# Patient Record
Sex: Female | Born: 1985 | Race: White | Hispanic: No | Marital: Married | Smoking: Never smoker
Health system: Southern US, Community
[De-identification: ages and names within clinical notes are randomized; demographics above are authoritative.]

## PROBLEM LIST (undated history)

## (undated) DIAGNOSIS — R87629 Unspecified abnormal cytological findings in specimens from vagina: Secondary | ICD-10-CM

## (undated) HISTORY — DX: Unspecified abnormal cytological findings in specimens from vagina: R87.629

---

## 2006-07-04 HISTORY — PX: BREAST SURGERY: SHX581

## 2008-11-23 ENCOUNTER — Emergency Department (HOSPITAL_COMMUNITY): Admission: EM | Admit: 2008-11-23 | Discharge: 2008-11-23 | Payer: Self-pay | Admitting: Family Medicine

## 2012-11-05 ENCOUNTER — Other Ambulatory Visit (HOSPITAL_COMMUNITY): Payer: Self-pay | Admitting: Obstetrics and Gynecology

## 2012-11-05 DIAGNOSIS — N979 Female infertility, unspecified: Secondary | ICD-10-CM

## 2012-11-08 ENCOUNTER — Ambulatory Visit (HOSPITAL_COMMUNITY)
Admission: RE | Admit: 2012-11-08 | Discharge: 2012-11-08 | Disposition: A | Discharge status: Home / Self Care | Payer: BC Managed Care – PPO | Source: Ambulatory Visit | Attending: Obstetrics and Gynecology | Admitting: Obstetrics and Gynecology

## 2012-11-08 DIAGNOSIS — N979 Female infertility, unspecified: Secondary | ICD-10-CM | POA: Insufficient documentation

## 2012-11-08 MED ORDER — IOHEXOL 300 MG/ML  SOLN
20.0000 mL | Freq: Once | INTRAMUSCULAR | Status: AC | PRN
  Administered 2012-11-08: 5 mL

## 2013-09-28 IMAGING — RF DG HYSTEROGRAM
6 series · 6 of 6 positions shown · non-contrast
Comparison: none

CLINICAL DATA: Infertility

HYSTEROSALPINGOGRAM
TECHNIQUE: Hysterosalpingogram was performed by the ordering
physician under fluoroscopy.  Fluoroscopic images are submitted for
interpretation following the procedure.
Fluoroscopy Time:  30 seconds

[Series 1: run · 1 of 1 slices shown (1 of 6)]
[im 1/1]
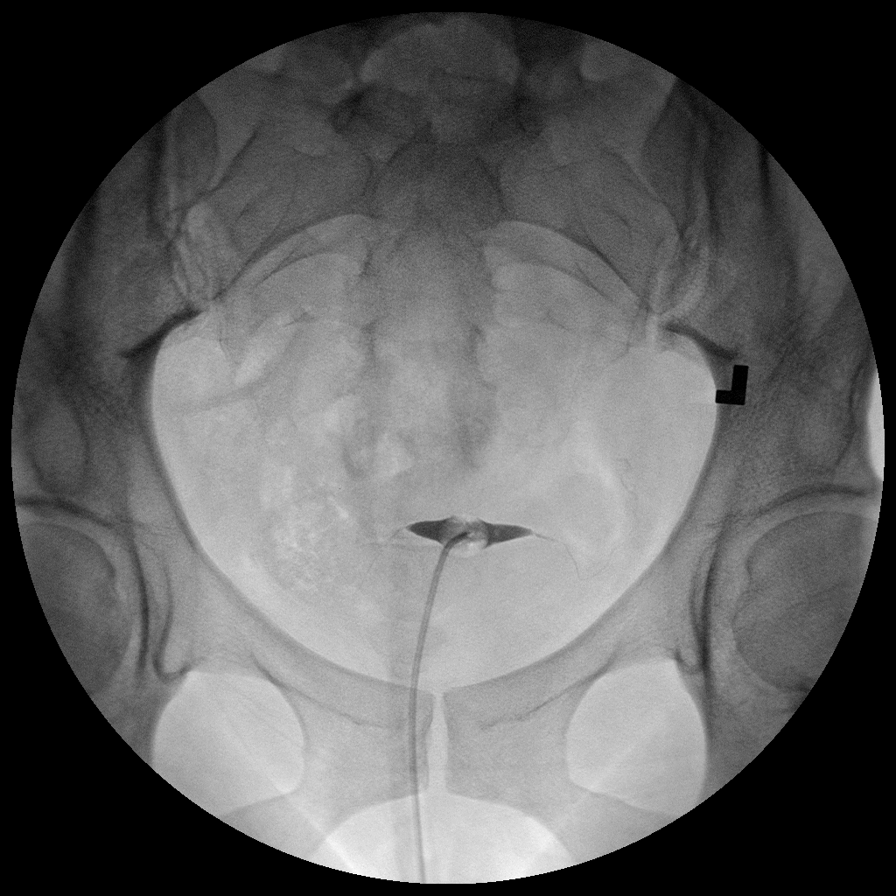

[Series 2: run · 1 of 1 slices shown (2 of 6)]
[im 1/1]
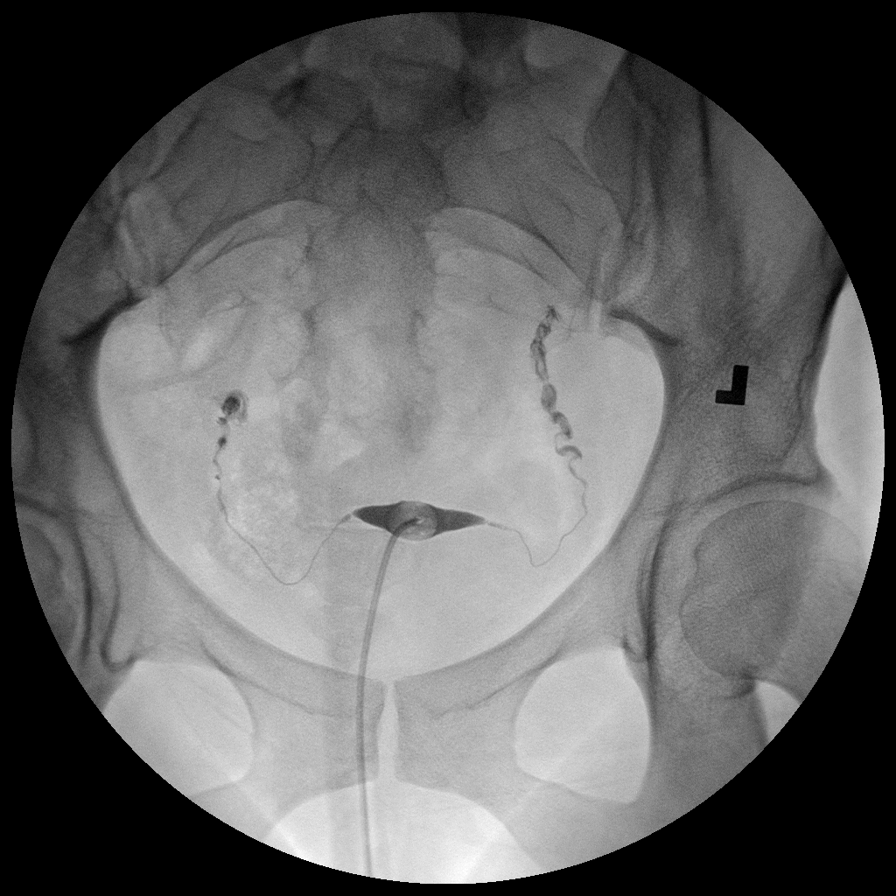

[Series 3: run · 1 of 1 slices shown (3 of 6)]
[im 1/1]
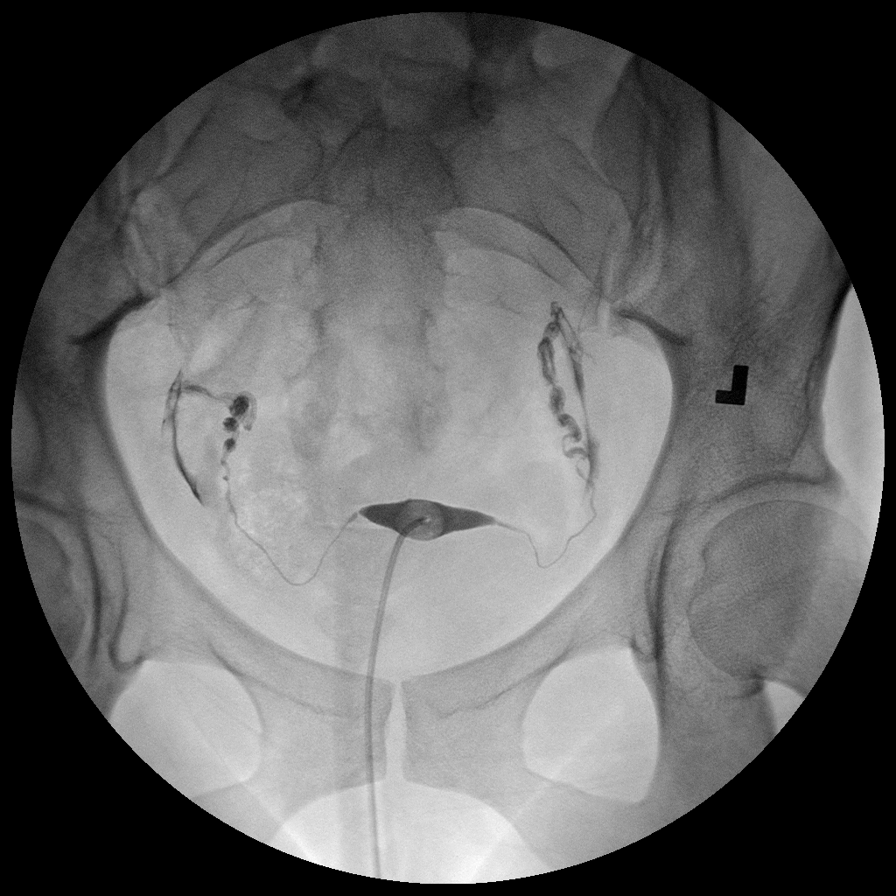

[Series 4: run · 1 of 1 slices shown (4 of 6)]
[im 1/1]
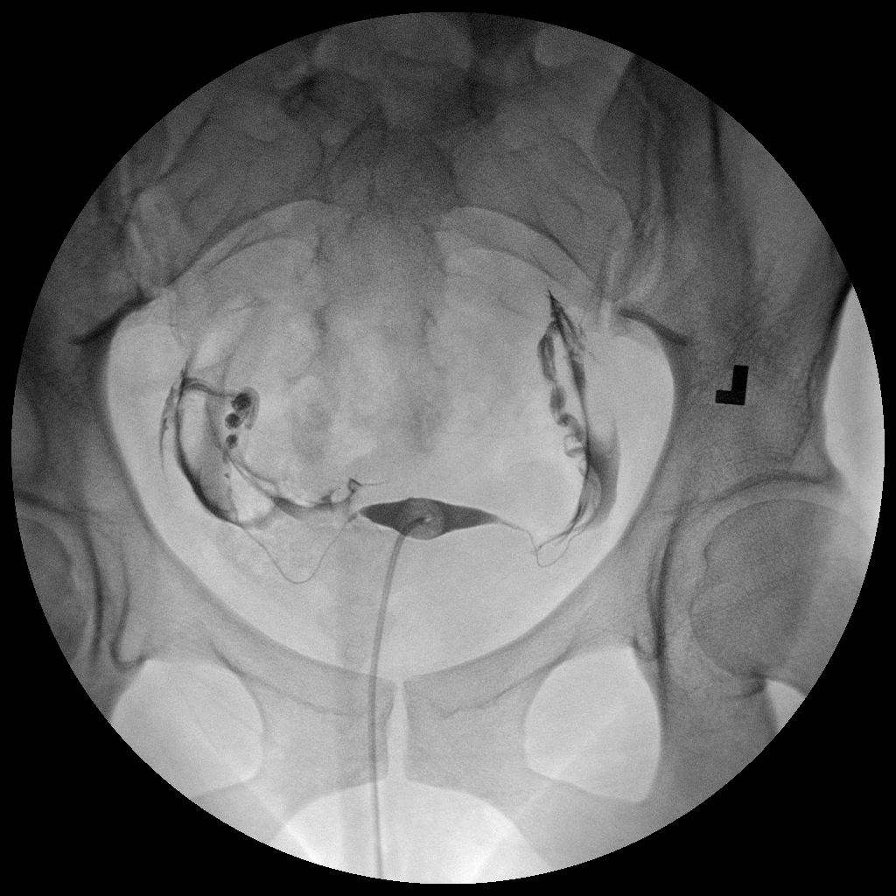

[Series 5: run · 1 of 1 slices shown (5 of 6)]
[im 1/1]
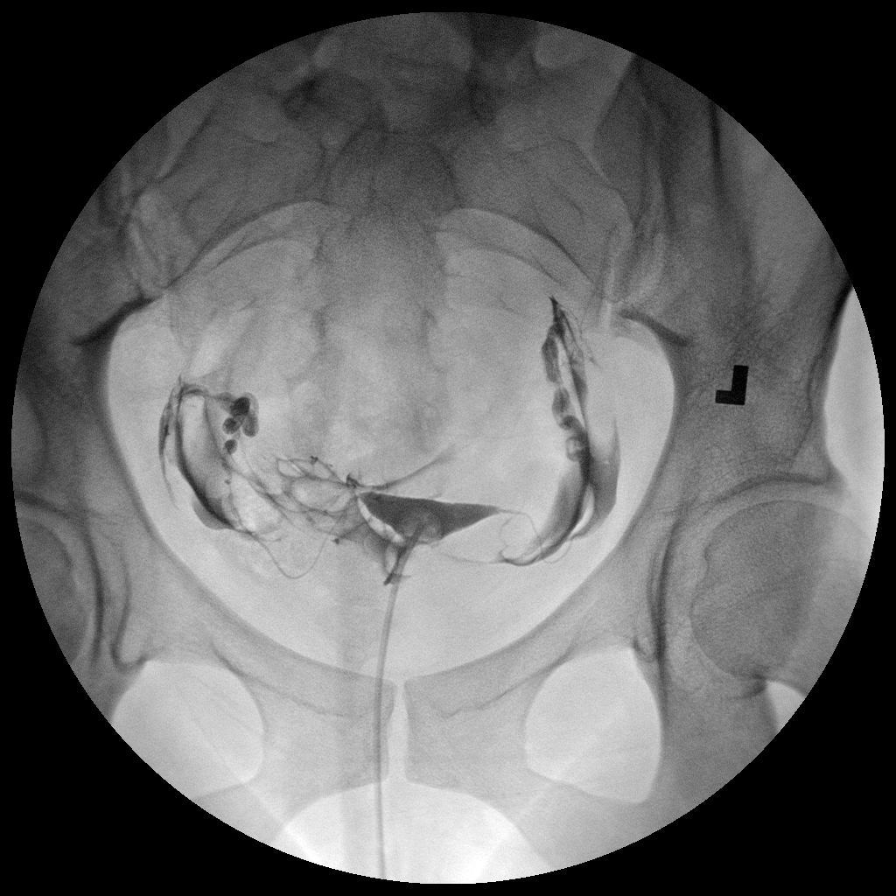

[Series 6: run · 1 of 1 slices shown (6 of 6)]
[im 1/1]
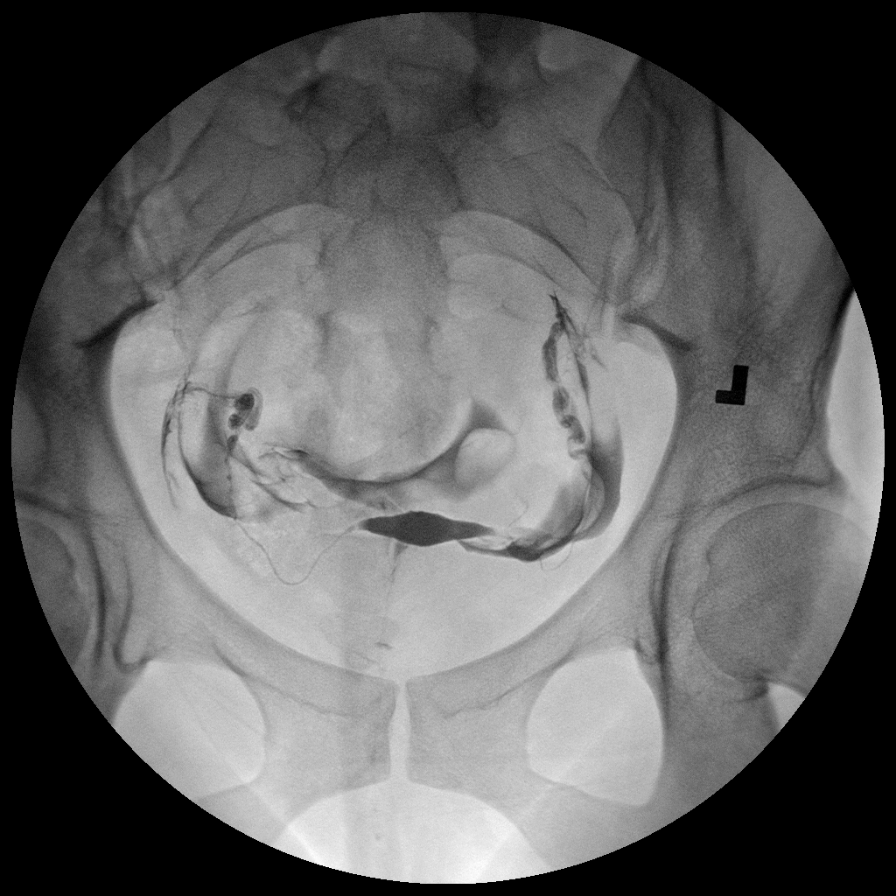

[6 of 6 positions shown; findings below may reference images not displayed]

FINDINGS: The endometrial cavity of the uterus is normal in contour
and appearance.

Contrast filling of both fallopian tubes is seen, and both tubes
are normal in appearance.  Intraperitoneal spill of the contrast
from both fallopian tubes is demonstrated.
IMPRESSION: Normal study.  Fallopian tubes are patent bilaterally.

## 2015-02-03 LAB — OB RESULTS CONSOLE ABO/RH: RH TYPE: POSITIVE

## 2015-02-03 LAB — OB RESULTS CONSOLE HEPATITIS B SURFACE ANTIGEN: Hepatitis B Surface Ag: NEGATIVE

## 2015-02-03 LAB — OB RESULTS CONSOLE GC/CHLAMYDIA
CHLAMYDIA, DNA PROBE: NEGATIVE
GC PROBE AMP, GENITAL: NEGATIVE

## 2015-02-03 LAB — OB RESULTS CONSOLE RPR: RPR: NONREACTIVE

## 2015-02-03 LAB — OB RESULTS CONSOLE ANTIBODY SCREEN: Antibody Screen: NEGATIVE

## 2015-02-03 LAB — OB RESULTS CONSOLE RUBELLA ANTIBODY, IGM: RUBELLA: IMMUNE

## 2015-02-03 LAB — OB RESULTS CONSOLE HIV ANTIBODY (ROUTINE TESTING): HIV: NONREACTIVE

## 2015-06-19 ENCOUNTER — Inpatient Hospital Stay (HOSPITAL_COMMUNITY)
Admission: AD | Admit: 2015-06-19 | Payer: BC Managed Care – PPO | Source: Ambulatory Visit | Admitting: Obstetrics and Gynecology

## 2015-07-05 NOTE — L&D Delivery Note (Signed)
Delivery Note  SVD viable female Apgars 8,9 over 2nd degree MLE.  Nuchal x 1 reduced.  Placenta delivered spontaneously intact with 3VC. Repair with 2-0 Chromic with good support and hemostasis noted and R/V exam confirms.  PH art was sent.  [place] cord blood was not done.  Mother and baby were doing well.  EBL 250cc  Candice Campavid Joleah Kosak, MD

## 2015-08-11 LAB — OB RESULTS CONSOLE GBS: STREP GROUP B AG: NEGATIVE

## 2015-09-03 ENCOUNTER — Encounter (HOSPITAL_COMMUNITY): Payer: Self-pay | Admitting: *Deleted

## 2015-09-03 ENCOUNTER — Telehealth (HOSPITAL_COMMUNITY): Payer: Self-pay | Admitting: *Deleted

## 2015-09-03 NOTE — Telephone Encounter (Signed)
Preadmission screen  

## 2015-09-07 ENCOUNTER — Inpatient Hospital Stay (HOSPITAL_COMMUNITY): Payer: BC Managed Care – PPO | Admitting: Anesthesiology

## 2015-09-07 ENCOUNTER — Encounter (HOSPITAL_COMMUNITY): Payer: Self-pay

## 2015-09-07 ENCOUNTER — Inpatient Hospital Stay (HOSPITAL_COMMUNITY)
Admission: RE | Admit: 2015-09-07 | Discharge: 2015-09-09 | DRG: 775 | Disposition: A | Discharge status: Home / Self Care | Payer: BC Managed Care – PPO | Source: Ambulatory Visit | Attending: Obstetrics and Gynecology | Admitting: Obstetrics and Gynecology

## 2015-09-07 DIAGNOSIS — Z6834 Body mass index (BMI) 34.0-34.9, adult: Secondary | ICD-10-CM

## 2015-09-07 DIAGNOSIS — O2243 Hemorrhoids in pregnancy, third trimester: Secondary | ICD-10-CM | POA: Diagnosis present

## 2015-09-07 DIAGNOSIS — E669 Obesity, unspecified: Secondary | ICD-10-CM | POA: Diagnosis present

## 2015-09-07 DIAGNOSIS — Z3A40 40 weeks gestation of pregnancy: Secondary | ICD-10-CM

## 2015-09-07 DIAGNOSIS — O99214 Obesity complicating childbirth: Secondary | ICD-10-CM | POA: Diagnosis present

## 2015-09-07 DIAGNOSIS — Z833 Family history of diabetes mellitus: Secondary | ICD-10-CM

## 2015-09-07 DIAGNOSIS — O26893 Other specified pregnancy related conditions, third trimester: Secondary | ICD-10-CM | POA: Diagnosis present

## 2015-09-07 LAB — RPR: RPR: NONREACTIVE

## 2015-09-07 LAB — CBC
HCT: 32.6 % — ABNORMAL LOW (ref 36.0–46.0)
Hemoglobin: 11 g/dL — ABNORMAL LOW (ref 12.0–15.0)
MCH: 32.4 pg (ref 26.0–34.0)
MCHC: 33.7 g/dL (ref 30.0–36.0)
MCV: 95.9 fL (ref 78.0–100.0)
PLATELETS: 165 10*3/uL (ref 150–400)
RBC: 3.4 MIL/uL — AB (ref 3.87–5.11)
RDW: 15 % (ref 11.5–15.5)
WBC: 8.9 10*3/uL (ref 4.0–10.5)

## 2015-09-07 LAB — TYPE AND SCREEN
ABO/RH(D): O POS
Antibody Screen: NEGATIVE

## 2015-09-07 LAB — ABO/RH: ABO/RH(D): O POS

## 2015-09-07 MED ORDER — ONDANSETRON HCL 4 MG/2ML IJ SOLN: 4.0000 mg | Freq: Four times a day (QID) | INTRAMUSCULAR | Status: DC | PRN

## 2015-09-07 MED ORDER — BUPIVACAINE HCL (PF) 0.25 % IJ SOLN
INTRAMUSCULAR | Status: DC | PRN
  Administered 2015-09-07: 7 mL via EPIDURAL

## 2015-09-07 MED ORDER — EPHEDRINE 5 MG/ML INJ: 10.0000 mg | INTRAVENOUS | Status: DC | PRN

## 2015-09-07 MED ORDER — IBUPROFEN 600 MG PO TABS
600.0000 mg | ORAL_TABLET | Freq: Four times a day (QID) | ORAL | Status: DC
  Administered 2015-09-08 – 2015-09-09 (×6): 600 mg via ORAL
  Filled 2015-09-07 (×6): qty 1

## 2015-09-07 MED ORDER — ONDANSETRON HCL 4 MG PO TABS: 4.0000 mg | ORAL_TABLET | ORAL | Status: DC | PRN

## 2015-09-07 MED ORDER — OXYCODONE-ACETAMINOPHEN 5-325 MG PO TABS: 1.0000 | ORAL_TABLET | ORAL | Status: DC | PRN

## 2015-09-07 MED ORDER — WITCH HAZEL-GLYCERIN EX PADS: 1.0000 "application " | MEDICATED_PAD | CUTANEOUS | Status: DC | PRN

## 2015-09-07 MED ORDER — ZOLPIDEM TARTRATE 5 MG PO TABS: 5.0000 mg | ORAL_TABLET | Freq: Every evening | ORAL | Status: DC | PRN

## 2015-09-07 MED ORDER — MEASLES, MUMPS & RUBELLA VAC ~~LOC~~ INJ: 0.5000 mL | INJECTION | Freq: Once | SUBCUTANEOUS | Status: DC

## 2015-09-07 MED ORDER — OXYTOCIN 10 UNIT/ML IJ SOLN
2.5000 [IU]/h | INTRAVENOUS | Status: DC
  Filled 2015-09-07: qty 10

## 2015-09-07 MED ORDER — LACTATED RINGERS IV SOLN: INTRAVENOUS | Status: DC

## 2015-09-07 MED ORDER — TETANUS-DIPHTH-ACELL PERTUSSIS 5-2.5-18.5 LF-MCG/0.5 IM SUSP
0.5000 mL | Freq: Once | INTRAMUSCULAR | Status: DC
  Filled 2015-09-07: qty 0.5

## 2015-09-07 MED ORDER — FENTANYL 2.5 MCG/ML BUPIVACAINE 1/10 % EPIDURAL INFUSION (WH - ANES)
14.0000 mL/h | INTRAMUSCULAR | Status: DC | PRN
  Administered 2015-09-07 (×2): 14 mL/h via EPIDURAL
  Filled 2015-09-07 (×2): qty 125

## 2015-09-07 MED ORDER — OXYTOCIN BOLUS FROM INFUSION: 500.0000 mL | INTRAVENOUS | Status: DC

## 2015-09-07 MED ORDER — SENNOSIDES-DOCUSATE SODIUM 8.6-50 MG PO TABS
2.0000 | ORAL_TABLET | ORAL | Status: DC
  Administered 2015-09-08: 2 via ORAL
  Filled 2015-09-07 (×3): qty 2

## 2015-09-07 MED ORDER — DIPHENHYDRAMINE HCL 50 MG/ML IJ SOLN: 12.5000 mg | INTRAMUSCULAR | Status: DC | PRN

## 2015-09-07 MED ORDER — TERBUTALINE SULFATE 1 MG/ML IJ SOLN
0.2500 mg | Freq: Once | INTRAMUSCULAR | Status: DC | PRN
Start: 2015-09-07 — End: 2015-09-07

## 2015-09-07 MED ORDER — LACTATED RINGERS IV SOLN
500.0000 mL | INTRAVENOUS | Status: DC | PRN
  Administered 2015-09-07: 1000 mL via INTRAVENOUS

## 2015-09-07 MED ORDER — LANOLIN HYDROUS EX OINT: TOPICAL_OINTMENT | CUTANEOUS | Status: DC | PRN

## 2015-09-07 MED ORDER — OXYCODONE-ACETAMINOPHEN 5-325 MG PO TABS: 2.0000 | ORAL_TABLET | ORAL | Status: DC | PRN

## 2015-09-07 MED ORDER — PRENATAL MULTIVITAMIN CH
1.0000 | ORAL_TABLET | Freq: Every day | ORAL | Status: DC
  Administered 2015-09-08: 1 via ORAL
  Filled 2015-09-07: qty 1

## 2015-09-07 MED ORDER — BENZOCAINE-MENTHOL 20-0.5 % EX AERO
1.0000 "application " | INHALATION_SPRAY | CUTANEOUS | Status: DC | PRN
  Administered 2015-09-08: 1 via TOPICAL
  Filled 2015-09-07 (×4): qty 56

## 2015-09-07 MED ORDER — SIMETHICONE 80 MG PO CHEW: 80.0000 mg | CHEWABLE_TABLET | ORAL | Status: DC | PRN

## 2015-09-07 MED ORDER — CITRIC ACID-SODIUM CITRATE 334-500 MG/5ML PO SOLN: 30.0000 mL | ORAL | Status: DC | PRN

## 2015-09-07 MED ORDER — MEDROXYPROGESTERONE ACETATE 150 MG/ML IM SUSP: 150.0000 mg | INTRAMUSCULAR | Status: DC | PRN

## 2015-09-07 MED ORDER — FLEET ENEMA 7-19 GM/118ML RE ENEM: 1.0000 | ENEMA | RECTAL | Status: DC | PRN

## 2015-09-07 MED ORDER — ONDANSETRON HCL 4 MG/2ML IJ SOLN
4.0000 mg | INTRAMUSCULAR | Status: DC | PRN
  Administered 2015-09-07: 4 mg via INTRAVENOUS
  Filled 2015-09-07: qty 2

## 2015-09-07 MED ORDER — PHENYLEPHRINE 40 MCG/ML (10ML) SYRINGE FOR IV PUSH (FOR BLOOD PRESSURE SUPPORT)
80.0000 ug | PREFILLED_SYRINGE | INTRAVENOUS | Status: DC | PRN
  Filled 2015-09-07: qty 20

## 2015-09-07 MED ORDER — DIPHENHYDRAMINE HCL 25 MG PO CAPS: 25.0000 mg | ORAL_CAPSULE | Freq: Four times a day (QID) | ORAL | Status: DC | PRN

## 2015-09-07 MED ORDER — LACTATED RINGERS IV SOLN
1.0000 m[IU]/min | INTRAVENOUS | Status: DC
  Administered 2015-09-07: 2 m[IU]/min via INTRAVENOUS

## 2015-09-07 MED ORDER — ACETAMINOPHEN 325 MG PO TABS
650.0000 mg | ORAL_TABLET | ORAL | Status: DC | PRN
  Administered 2015-09-08 – 2015-09-09 (×5): 650 mg via ORAL
  Filled 2015-09-07 (×5): qty 2

## 2015-09-07 MED ORDER — LIDOCAINE HCL (PF) 1 % IJ SOLN: 30.0000 mL | INTRAMUSCULAR | Status: DC | PRN

## 2015-09-07 MED ORDER — LACTATED RINGERS IV SOLN: 500.0000 mL | Freq: Once | INTRAVENOUS | Status: DC

## 2015-09-07 MED ORDER — DIBUCAINE 1 % RE OINT
1.0000 | TOPICAL_OINTMENT | RECTAL | Status: DC | PRN
Start: 2015-09-07 — End: 2015-09-09
  Filled 2015-09-07: qty 28

## 2015-09-07 MED ORDER — LIDOCAINE HCL (PF) 1 % IJ SOLN
INTRAMUSCULAR | Status: DC | PRN
  Administered 2015-09-07 (×2): 4 mL

## 2015-09-07 MED ORDER — PHENYLEPHRINE 40 MCG/ML (10ML) SYRINGE FOR IV PUSH (FOR BLOOD PRESSURE SUPPORT): 80.0000 ug | PREFILLED_SYRINGE | INTRAVENOUS | Status: DC | PRN

## 2015-09-07 MED ORDER — ACETAMINOPHEN 325 MG PO TABS: 650.0000 mg | ORAL_TABLET | ORAL | Status: DC | PRN

## 2015-09-07 NOTE — Anesthesia Preprocedure Evaluation (Signed)

## 2015-09-07 NOTE — Consults (Signed)
  Anesthesia Pain Consult Note  Patient: Marylouise StacksKaren Nebel, 30 y.o., female  Consult Requested by: Candice Campavid Lowe, MD  Reason for Consult: CRNA Epidural/Pain Rounds   Level of Consciousness: alert  Pain: Pain Goal: 4/10; Current Pain: 1/10.  Last Vitals:  Filed Vitals:   09/07/15 1500 09/07/15 1501  BP: 155/104 155/104  Pulse: 95 95  Temp: 36.6 C     Plan: Epidural infusion for pain control.   Tevita Gomer L 09/07/2015

## 2015-09-07 NOTE — H&P (Signed)
Haley Mora is a 30 y.o. female presenting for IOL due to favorable cervix and patient request.  Pregnancy uncomplicated.  GBS-. History OB History    Gravida Para Term Preterm AB TAB SAB Ectopic Multiple Living   1              Past Medical History  Diagnosis Date  . Vaginal Pap smear, abnormal    Past Surgical History  Procedure Laterality Date  . Breast surgery  2008    aug   Family History: family history includes Deep vein thrombosis in her mother; Diabetes in her paternal grandfather; Hyperlipidemia in her father; Urolithiasis in her father. Social History:  reports that she has never smoked. She has never used smokeless tobacco. She reports that she does not drink alcohol or use illicit drugs.   Prenatal Transfer Tool  Maternal Diabetes: No Genetic Screening: Normal Maternal Ultrasounds/Referrals: Normal Fetal Ultrasounds or other Referrals:  None Maternal Substance Abuse:  No Significant Maternal Medications:  None Significant Maternal Lab Results:  None Other Comments:  None  ROS  Dilation: 2 Effacement (%): 60 Station: -2 Exam by:: Denyce Harr Height 5\' 6"  (1.676 m), weight 215 lb (97.523 kg), last menstrual period 12/01/2014. Exam Physical Exam  Prenatal labs: ABO, Rh: O/Positive/-- (08/02 0000) Antibody: Negative (08/02 0000) Rubella: Immune (08/02 0000) RPR: Nonreactive (08/02 0000)  HBsAg: Negative (08/02 0000)  HIV: Non-reactive (08/02 0000)  GBS: Negative (02/07 0000)   Assessment/Plan: IUP at 40 weeks AROM and pitocin IOL Anticipate SVD   Haley Mora 09/07/2015, 8:41 AM

## 2015-09-07 NOTE — Anesthesia Procedure Notes (Signed)
Epidural Patient location during procedure: OB  Staffing Anesthesiologist: Lin Hackmann Performed by: anesthesiologist   Preanesthetic Checklist Completed: patient identified, site marked, surgical consent, pre-op evaluation, timeout performed, IV checked, risks and benefits discussed and monitors and equipment checked  Epidural Patient position: sitting Prep: site prepped and draped and DuraPrep Patient monitoring: continuous pulse ox and blood pressure Approach: midline Location: L3-L4 Injection technique: LOR saline  Needle:  Needle type: Tuohy  Needle gauge: 17 G Needle length: 9 cm and 9 Needle insertion depth: 6 cm Catheter type: closed end flexible Catheter size: 19 Gauge Catheter at skin depth: 10 cm Test dose: negative  Assessment Events: blood not aspirated, injection not painful, no injection resistance, negative IV test and no paresthesia  Additional Notes Patient identified. Risks/Benefits/Options discussed with patient including but not limited to bleeding, infection, nerve damage, paralysis, failed block, incomplete pain control, headache, blood pressure changes, nausea, vomiting, reactions to medication both or allergic, itching and postpartum back pain. Confirmed with bedside nurse the patient's most recent platelet count. Confirmed with patient that they are not currently taking any anticoagulation, have any bleeding history or any family history of bleeding disorders. Patient expressed understanding and wished to proceed. All questions were answered. Sterile technique was used throughout the entire procedure. Please see nursing notes for vital signs. Test dose was given through epidural catheter and negative prior to continuing to dose epidural or start infusion. Warning signs of high block given to the patient including shortness of breath, tingling/numbness in hands, complete motor block, or any concerning symptoms with instructions to call for help. Patient was  given instructions on fall risk and not to get out of bed. All questions and concerns addressed with instructions to call with any issues or inadequate analgesia.    

## 2015-09-08 LAB — CBC
HCT: 27.7 % — ABNORMAL LOW (ref 36.0–46.0)
Hemoglobin: 9.2 g/dL — ABNORMAL LOW (ref 12.0–15.0)
MCH: 32.3 pg (ref 26.0–34.0)
MCHC: 33.2 g/dL (ref 30.0–36.0)
MCV: 97.2 fL (ref 78.0–100.0)
PLATELETS: 163 10*3/uL (ref 150–400)
RBC: 2.85 MIL/uL — ABNORMAL LOW (ref 3.87–5.11)
RDW: 15.2 % (ref 11.5–15.5)
WBC: 16.9 10*3/uL — AB (ref 4.0–10.5)

## 2015-09-08 NOTE — Progress Notes (Signed)
Post Partum Day 1 Subjective: no complaints, up ad lib, voiding, tolerating PO and + flatus  Objective: Blood pressure 133/81, pulse 99, temperature 98.3 F (36.8 C), temperature source Oral, resp. rate 18, height 5\' 6"  (1.676 m), weight 215 lb (97.523 kg), last menstrual period 12/01/2014, SpO2 100 %, unknown if currently breastfeeding.  Physical Exam:  General: alert and cooperative Lochia: appropriate Uterine Fundus: firm Incision: healing well DVT Evaluation: No evidence of DVT seen on physical exam. Negative Homan's sign. No cords or calf tenderness. Calf/Ankle edema is present.   Recent Labs  09/07/15 0745 09/08/15 0533  HGB 11.0* 9.2*  HCT 32.6* 27.7*    Assessment/Plan: Plan for discharge tomorrow and Circumcision prior to discharge   LOS: 1 day   Domonic Hiscox G 09/08/2015, 8:12 AM

## 2015-09-08 NOTE — Anesthesia Postprocedure Evaluation (Signed)
Anesthesia Post Note  Patient: Haley StacksKaren Heatherly  Procedure(s) Performed: * No procedures listed *  Patient location during evaluation: Mother Baby Anesthesia Type: Epidural Level of consciousness: awake and alert Pain management: pain level controlled Vital Signs Assessment: post-procedure vital signs reviewed and stable Respiratory status: spontaneous breathing, nonlabored ventilation and respiratory function stable Cardiovascular status: stable Postop Assessment: no headache, no backache and epidural receding Anesthetic complications: no Comments: Current pain 3, pain goal 4    Last Vitals:  Filed Vitals:   09/08/15 0130 09/08/15 0530  BP: 133/76 133/81  Pulse: 124 99  Temp: 37.1 C 36.8 C  Resp: 20 18    Last Pain:  Filed Vitals:   09/08/15 0544  PainSc: 4                  Mackinley Cassaday

## 2015-09-08 NOTE — Lactation Note (Addendum)
This note was copied from a baby's chart. Lactation Consultation Note New mom anxious about baby. Baby fussy, mom has inverted nipples and mom trying to latch baby, which was unsuccessful. Breast feeling slightly heavy. Swaddled baby and encouraged FOB to hold baby while I hand expressed mom to get colostrum and evert nipples. Taught breast massage and hand expression. Obtained 2 ml colostrum. Fitted #20Ns latched baby, baby suckled on breast, wasn't painful at first, then became painful. Flanged mouth. Baby pulled off, baby clamping down at times. Noted nipple pinched. Removed #20 NS. Applied #16, fit better. Baby latched better, still clamps at intervals, frequently needing chin readjusted.  "Baby has a severe recessed chin"  The bottom lip folds into mouth it is so recessed. Also baby has a sm. Pea size skin tag in the center of chin attached by a thin piece of skin, allowing skin tag to flop around, and at times when baby's bottom lip curls into mouth you can't see the skin tag and the top lip is resting on chin. After BF the skin tag top part appeared slightly red maybe d/t friction of bottom lip flanged out suckling on NS?Marland Kitchen. Mom and dad bothered by skin tag, wanting MD to clip it in am.  Gave mom shells to wear in bra in am. Hand pump given to pre-pump prior to application of nipple shield. Mom encouraged to feed baby 8-12 times/24 hours and with feeding cues. Referred to Baby and Me Book in Breastfeeding section Pg. 22-23 for position options and Proper latch demonstration. Educated about newborn behavior, STS, I&O, cluster feeding, relaxation, supply and demand. Mom asked if the BF didn't work out could she pump and bottle feed as long as he got her breast milk. I answered ever how she wants to feed her baby we will assist her. WH/LC brochure given w/resources, support groups and LC services.  Patient Name: Haley Mora StacksKaren Piacente [id number]'s Date: 09/08/2015 Reason for consult: Initial assessment   Maternal  Data Has patient been taught Hand Expression?: Yes Does the patient have breastfeeding experience prior to this delivery?: No  Feeding Feeding Type: Breast Milk Length of feed: 10 min  LATCH Score/Interventions Latch: Repeated attempts needed to sustain latch, nipple held in mouth throughout feeding, stimulation needed to elicit sucking reflex. Intervention(s): Adjust position;Assist with latch;Breast massage;Breast compression  Audible Swallowing: None Intervention(s): Skin to skin;Hand expression  Type of Nipple: Inverted Intervention(s): Shells;Hand pump  Comfort (Breast/Nipple): Soft / non-tender     Hold (Positioning): Full assist, staff holds infant at breast Intervention(s): Breastfeeding basics reviewed;Support Pillows;Position options;Skin to skin  LATCH Score: 3  Lactation Tools Discussed/Used Tools: Shells;Nipple Dorris CarnesShields;Pump Nipple shield size: 16;20 Shell Type: Inverted Breast pump type: Manual WIC Program: No Pump Review: Setup, frequency, and cleaning;Milk Storage Initiated by:: MH Date initiated:: 09/08/15   Consult Status Consult Status: Follow-up Date: 09/08/15 Follow-up type: In-patient    Juvencio Verdi, Diamond NickelLAURA G 09/08/2015, 3:05 AM

## 2015-09-09 MED ORDER — IBUPROFEN 600 MG PO TABS: 600.0000 mg | ORAL_TABLET | Freq: Four times a day (QID) | ORAL | Status: DC

## 2015-09-09 MED ORDER — OXYCODONE-ACETAMINOPHEN 5-325 MG PO TABS: 1.0000 | ORAL_TABLET | ORAL | Status: DC | PRN

## 2015-09-09 NOTE — Lactation Note (Addendum)
This note was copied from a baby's chart. Lactation Consultation Note  Patient Name: Haley Mora Date: 09/09/2015 Reason for consult: Follow-up assessment;Breast/nipple pain;Difficult latch   Follow up with mom of 36 hour old infant who is returning from circumcision. Infant with 4 BF for 10-20 minutes using #20 NS, 4 attempts, EBM supplementation x 4 of 3-5 cc via spoon and bottle, 4  formula supplementation (Alimentum) of 5-20 cc, 2 voids, 5 stools and 1 emesis in last 24 hours. Infant weight 7 lb 15.7 oz with a weight loss of 2%.   Mom with large compressible breasts and flat nipples. Both nipples excoriated and scabbed. Mom is using EBM, comfort gels, and breast shells for nipples. Reviewed nipple care with mom. Mom is using DEBP prior to feeding and shells sometimes between feedings. Infant with recessed chin and also had a skin tag removed from just below lower lip yesterday.   Infant was awake and cueing to feed, offered assistance with feeding. Mom unsure that she wants help with BF this am. She reports she has had a difficult time with BF and infant crying and that she just wants to feed her infant. She reports she is now using a bottle and feels that has been much better for her and the baby. She agreed to assistance for a short try. We put infant to breast in the cross cradle hold to the left breast, he did not want to open mouth and cried when latched to NS. NS was primed with formula. He did not elicit sucking reflex. Infant was then given a bottle with EBM, he took 3cc, than an additional 7 cc formula. He then did not want to eat more at this time.   Mom is pumping about every 3 hours using DEBP and getting 3-5 cc EBM.  Mom voiced that she is extremely tired and feels like she is in "survival mode". Mom voiced that her goal at this point is to get home and BF infant as she is able and to pump and give all EBM to infant. She says she does not like using the NS and is not sure  if she will use it at home. She has a DEBP at home for use. She is going to call and make an appointment with Darlin Priestly, IBCLC and Ped. Mom is aware that BF infants need to be seen in 24-48 hours after d/c. Did advise mom to protect milk supply by pumping every 2-3 hours with NS use and if infant not BF. Also advised her that I recommend that she have a follow up [facility] appointment in about a week. Mom would like to get home and call back for appointment as needed or maintain follow up with LC at [facility] office. Mom has formula supplementation sheet and amounts based on day of age was reviewed with mom.   Reviewed all BF information in Taking Care of Baby and Me Booklet. Engorgement prevention/treatment reviewed with mom. Reviewed I/O and enc mom to maintain feeding log and take to [facility] and Ped. Visits. Mom has LC Brochure and LC Phone # to call as needed.    Maternal Data Does the patient have breastfeeding experience prior to this delivery?: No  Feeding Feeding Type: Breast Fed Nipple Type: Slow - flow Length of feed: 0 min  LATCH Score/Interventions Latch: Too sleepy or reluctant, no latch achieved, no sucking elicited. Intervention(s): Skin to skin;Teach feeding cues;Waking techniques Intervention(s): Adjust position;Assist with latch;Breast massage;Breast compression  Audible  Swallowing: None  Type of Nipple: Flat Intervention(s): Shells;Double electric pump  Comfort (Breast/Nipple): Filling, red/small blisters or bruises, mild/mod discomfort  Problem noted: Cracked, bleeding, blisters, bruises Interventions  (Cracked/bleeding/bruising/blister): Expressed breast milk to nipple;Double electric pump  Hold (Positioning): Assistance needed to correctly position infant at breast and maintain latch. Intervention(s): Breastfeeding basics reviewed;Support Pillows;Position options;Skin to skin  LATCH Score: 3  Lactation Tools Discussed/Used Tools: Nipple Shields;Comfort gels;Other (comment)  (Breast Shells) Nipple shield size: 20 WIC Program: No Pump Review: Setup, frequency, and cleaning;Milk Storage   Consult Status Consult Status: PRN Follow-up type: Call as needed    Ed BlalockSharon S Kaia Depaolis 09/09/2015, 9:46 AM

## 2015-09-09 NOTE — Discharge Summary (Signed)
Obstetric Discharge Summary Reason for Admission: induction of labor Prenatal Procedures: ultrasound Intrapartum Procedures: spontaneous vaginal delivery Postpartum Procedures: none Complications-Operative and Postpartum: 2 degree perineal laceration HEMOGLOBIN  Date Value Ref Range Status  09/08/2015 9.2* 12.0 - 15.0 g/dL Final   HCT  Date Value Ref Range Status  09/08/2015 27.7* 36.0 - 46.0 % Final    Physical Exam:  General: alert and cooperative Lochia: appropriate Uterine Fundus: firm Incision: healing well, small non thrombosed hemorrhoid noted DVT Evaluation: No evidence of DVT seen on physical exam. Negative Homan's sign. No cords or calf tenderness. No significant calf/ankle edema.  Discharge Diagnoses: Term Pregnancy-delivered  Discharge Information: Date: 09/09/2015 Activity: pelvic rest Diet: routine Medications: PNV, Ibuprofen and Percocet Condition: stable Instructions: refer to practice specific booklet Discharge to: home   Newborn Data: Live born female  Birth Weight: 8 lb 2.5 oz (3700 g) APGAR: 8, 9  Home with mother.  Haley Mora G 09/09/2015, 8:30 AM

## 2021-12-07 LAB — HEPATITIS C ANTIBODY: HCV Ab: NEGATIVE

## 2021-12-07 LAB — OB RESULTS CONSOLE RUBELLA ANTIBODY, IGM: Rubella: IMMUNE

## 2021-12-07 LAB — OB RESULTS CONSOLE HIV ANTIBODY (ROUTINE TESTING): HIV: NONREACTIVE

## 2021-12-07 LAB — OB RESULTS CONSOLE HEPATITIS B SURFACE ANTIGEN: Hepatitis B Surface Ag: NEGATIVE

## 2021-12-22 LAB — OB RESULTS CONSOLE GC/CHLAMYDIA
Chlamydia: NEGATIVE
Neisseria Gonorrhea: NEGATIVE

## 2022-06-22 LAB — OB RESULTS CONSOLE GBS: GBS: NEGATIVE

## 2022-07-04 ENCOUNTER — Other Ambulatory Visit: Payer: Self-pay

## 2022-07-04 ENCOUNTER — Inpatient Hospital Stay (HOSPITAL_COMMUNITY)
Admission: AD | Admit: 2022-07-04 | Discharge: 2022-07-07 | DRG: 806 | Disposition: A | Discharge status: Home / Self Care | Payer: Managed Care, Other (non HMO) | Attending: Obstetrics & Gynecology | Admitting: Obstetrics & Gynecology

## 2022-07-04 ENCOUNTER — Encounter (HOSPITAL_COMMUNITY): Payer: Self-pay | Admitting: Obstetrics and Gynecology

## 2022-07-04 DIAGNOSIS — O26893 Other specified pregnancy related conditions, third trimester: Secondary | ICD-10-CM | POA: Diagnosis present

## 2022-07-04 DIAGNOSIS — O9902 Anemia complicating childbirth: Secondary | ICD-10-CM | POA: Diagnosis present

## 2022-07-04 DIAGNOSIS — Z3490 Encounter for supervision of normal pregnancy, unspecified, unspecified trimester: Secondary | ICD-10-CM

## 2022-07-04 DIAGNOSIS — O99892 Other specified diseases and conditions complicating childbirth: Secondary | ICD-10-CM | POA: Diagnosis present

## 2022-07-04 DIAGNOSIS — K047 Periapical abscess without sinus: Secondary | ICD-10-CM | POA: Diagnosis present

## 2022-07-04 DIAGNOSIS — Z3A38 38 weeks gestation of pregnancy: Secondary | ICD-10-CM

## 2022-07-04 DIAGNOSIS — D62 Acute posthemorrhagic anemia: Secondary | ICD-10-CM | POA: Diagnosis not present

## 2022-07-04 DIAGNOSIS — O4292 Full-term premature rupture of membranes, unspecified as to length of time between rupture and onset of labor: Principal | ICD-10-CM | POA: Diagnosis present

## 2022-07-04 LAB — URINALYSIS, ROUTINE W REFLEX MICROSCOPIC
Bilirubin Urine: NEGATIVE
Glucose, UA: NEGATIVE mg/dL
Hgb urine dipstick: NEGATIVE
Ketones, ur: 20 mg/dL — AB
Leukocytes,Ua: NEGATIVE
Nitrite: NEGATIVE
Protein, ur: NEGATIVE mg/dL
Specific Gravity, Urine: 1.005 (ref 1.005–1.030)
pH: 6 (ref 5.0–8.0)

## 2022-07-04 LAB — CBC
HCT: 34 % — ABNORMAL LOW (ref 36.0–46.0)
Hemoglobin: 11.3 g/dL — ABNORMAL LOW (ref 12.0–15.0)
MCH: 32.8 pg (ref 26.0–34.0)
MCHC: 33.2 g/dL (ref 30.0–36.0)
MCV: 98.6 fL (ref 80.0–100.0)
Platelets: 181 10*3/uL (ref 150–400)
RBC: 3.45 MIL/uL — ABNORMAL LOW (ref 3.87–5.11)
RDW: 15.9 % — ABNORMAL HIGH (ref 11.5–15.5)
WBC: 7.7 10*3/uL (ref 4.0–10.5)
nRBC: 0 % (ref 0.0–0.2)

## 2022-07-04 LAB — TYPE AND SCREEN
ABO/RH(D): O POS
Antibody Screen: NEGATIVE

## 2022-07-04 LAB — POCT FERN TEST: POCT Fern Test: POSITIVE

## 2022-07-04 LAB — HIV ANTIBODY (ROUTINE TESTING W REFLEX): HIV Screen 4th Generation wRfx: NONREACTIVE

## 2022-07-04 MED ORDER — ZOLPIDEM TARTRATE 5 MG PO TABS
5.0000 mg | ORAL_TABLET | Freq: Every evening | ORAL | Status: DC | PRN
  Filled 2022-07-04: qty 1

## 2022-07-04 MED ORDER — LACTATED RINGERS IV SOLN: INTRAVENOUS | Status: DC

## 2022-07-04 MED ORDER — OXYTOCIN-SODIUM CHLORIDE 30-0.9 UT/500ML-% IV SOLN: 2.5000 [IU]/h | INTRAVENOUS | Status: DC

## 2022-07-04 MED ORDER — ACETAMINOPHEN 325 MG PO TABS
650.0000 mg | ORAL_TABLET | ORAL | Status: DC | PRN
  Administered 2022-07-05: 650 mg via ORAL
  Filled 2022-07-04: qty 2

## 2022-07-04 MED ORDER — LACTATED RINGERS IV SOLN: 500.0000 mL | INTRAVENOUS | Status: DC | PRN

## 2022-07-04 MED ORDER — HYDROMORPHONE HCL 1 MG/ML IJ SOLN: 1.0000 mg | INTRAMUSCULAR | Status: DC | PRN

## 2022-07-04 MED ORDER — DIPHENOXYLATE-ATROPINE 2.5-0.025 MG PO TABS: 1.0000 | ORAL_TABLET | Freq: Four times a day (QID) | ORAL | Status: DC | PRN

## 2022-07-04 MED ORDER — OXYTOCIN-SODIUM CHLORIDE 30-0.9 UT/500ML-% IV SOLN
1.0000 m[IU]/min | INTRAVENOUS | Status: DC
  Administered 2022-07-05: 2 m[IU]/min via INTRAVENOUS
  Filled 2022-07-04: qty 500

## 2022-07-04 MED ORDER — OXYCODONE-ACETAMINOPHEN 5-325 MG PO TABS: 1.0000 | ORAL_TABLET | ORAL | Status: DC | PRN

## 2022-07-04 MED ORDER — AMOXICILLIN 500 MG PO CAPS
500.0000 mg | ORAL_CAPSULE | Freq: Three times a day (TID) | ORAL | Status: DC
  Administered 2022-07-05: 500 mg via ORAL
  Filled 2022-07-04 (×3): qty 1

## 2022-07-04 MED ORDER — OXYTOCIN BOLUS FROM INFUSION
333.0000 mL | Freq: Once | INTRAVENOUS | Status: AC
  Administered 2022-07-05: 333 mL via INTRAVENOUS

## 2022-07-04 MED ORDER — MISOPROSTOL 25 MCG QUARTER TABLET
25.0000 ug | ORAL_TABLET | ORAL | Status: DC
  Administered 2022-07-04 (×2): 25 ug via ORAL
  Filled 2022-07-04 (×2): qty 1

## 2022-07-04 MED ORDER — LIDOCAINE HCL (PF) 1 % IJ SOLN: 30.0000 mL | INTRAMUSCULAR | Status: DC | PRN

## 2022-07-04 MED ORDER — OXYCODONE-ACETAMINOPHEN 5-325 MG PO TABS: 2.0000 | ORAL_TABLET | ORAL | Status: DC | PRN

## 2022-07-04 MED ORDER — ESCITALOPRAM OXALATE 10 MG PO TABS
20.0000 mg | ORAL_TABLET | Freq: Every day | ORAL | Status: DC
  Administered 2022-07-05: 20 mg via ORAL
  Filled 2022-07-04: qty 1

## 2022-07-04 MED ORDER — FLEET ENEMA 7-19 GM/118ML RE ENEM: 1.0000 | ENEMA | RECTAL | Status: DC | PRN

## 2022-07-04 MED ORDER — TERBUTALINE SULFATE 1 MG/ML IJ SOLN: 0.2500 mg | Freq: Once | INTRAMUSCULAR | Status: DC | PRN

## 2022-07-04 MED ORDER — SOD CITRATE-CITRIC ACID 500-334 MG/5ML PO SOLN: 30.0000 mL | ORAL | Status: DC | PRN

## 2022-07-04 MED ORDER — ONDANSETRON HCL 4 MG/2ML IJ SOLN
4.0000 mg | Freq: Four times a day (QID) | INTRAMUSCULAR | Status: DC | PRN
  Administered 2022-07-05: 4 mg via INTRAVENOUS
  Filled 2022-07-04: qty 2

## 2022-07-04 MED ORDER — FAMOTIDINE 20 MG PO TABS
20.0000 mg | ORAL_TABLET | Freq: Two times a day (BID) | ORAL | Status: DC
  Administered 2022-07-04 – 2022-07-05 (×2): 20 mg via ORAL
  Filled 2022-07-04 (×2): qty 1

## 2022-07-04 NOTE — H&P (Signed)
Haley Mora is a 37 y.o. female presenting for SROM 24-48 hours ago. Unsure when it occurred.  She denies contractions.  She has a history of mood disorder c/w escitalopram. She has a prior SVD that was uncomplicated - 8#2 infant. Of note, she has had a dental infection that is being teated with amoxicillin - she has three days left of the course. She has had diarrhea multiple times yesterday. However, no diarrhea today.. Denies f/c/n/v.    OB History     Gravida  2   Para  1   Term  1   Preterm      AB      Living  1      SAB      IAB      Ectopic      Multiple  0   Live Births  1          Past Medical History:  Diagnosis Date   Vaginal Pap smear, abnormal    Past Surgical History:  Procedure Laterality Date   BREAST SURGERY  2008   aug   Family History: family history includes Deep vein thrombosis in her mother; Diabetes in her paternal grandfather; Hyperlipidemia in her father; Urolithiasis in her father. Social History:  reports that she has never smoked. She has never used smokeless tobacco. She reports that she does not drink alcohol and does not use drugs.     Maternal Diabetes: No Genetic Screening: Normal - panorama RR Maternal Ultrasounds/Referrals: Normal Fetal Ultrasounds or other Referrals:  None Maternal Substance Abuse:  No Significant Maternal Medications:  None Significant Maternal Lab Results:  Group B Strep negative Number of Prenatal Visits:greater than 3 verified prenatal visits Other Comments:  None  Review of Systems History Dilation: 1 Effacement (%): 50 Station: -2 Exam by:: Chelsea Blood pressure 131/79, pulse 92, temperature 98.4 F (36.9 C), temperature source Oral, resp. rate 18, height 5\' 6"  (1.676 m), weight 103.4 kg, SpO2 100 %, unknown if currently breastfeeding. Exam Physical Exam  NAD, A&O NWOB Abd soft, nondistended, gravid  Prenatal labs: ABO, Rh: --/--/O POS (01/01 1537) Antibody: NEG (01/01  1537) Rubella:   RPR:    HBsAg:    HIV:    GBS:   negative  Assessment/Plan: 37 yo G2P1 presenting with SROM.  Cervix unfavorable. Plan cytotec followed by pitocin/arom when cervix more favorable.   Continue Amoxicillin 500mg  TID x 3 more days- possible cause of diarrhea.  Continue Lexapro. 20mg  (takes in am).    Tyson Dense 07/04/2022, 5:06 PM

## 2022-07-04 NOTE — MAU Note (Signed)
.  Haley Mora is a 37 y.o. at [redacted]w[redacted]d here in MAU reporting: pt reports for about 24-48 hours leaking of fluid, but unsure if it is her water.  Pt reports clear fluid.  Reports +FM  Denies vaginal bleeding   Onset of complaint: 24-48 hours Pain score: denies  There were no vitals filed for this visit.    Lab orders placed from triage:  ua

## 2022-07-04 NOTE — Anesthesia Preprocedure Evaluation (Signed)
Anesthesia Evaluation  Patient identified by MRN, date of birth, ID band Patient awake    Reviewed: Allergy & Precautions, NPO status , Patient's Chart, lab work & pertinent test results  Airway Mallampati: II  TM Distance: >3 FB Neck ROM: Full    Dental no notable dental hx. (+) Teeth Intact, Dental Advisory Given   Pulmonary    Pulmonary exam normal breath sounds clear to auscultation       Cardiovascular Normal cardiovascular exam Rhythm:Regular Rate:Normal     Neuro/Psych    GI/Hepatic Neg liver ROS,,,  Endo/Other  negative endocrine ROS    Renal/GU negative Renal ROS     Musculoskeletal   Abdominal  (+) + obese  Peds  Hematology Lab Results      Component                Value               Date                           HGB                      11.3 (L)            07/04/2022                HCT                      34.0 (L)            07/04/2022                 PLT                      181                 07/04/2022              Anesthesia Other Findings   Reproductive/Obstetrics (+) Pregnancy                             Anesthesia Physical Anesthesia Plan  ASA: 3  Anesthesia Plan: Epidural   Post-op Pain Management:    Induction:   PONV Risk Score and Plan: Treatment may vary due to age or medical condition  Airway Management Planned:   Additional Equipment:   Intra-op Plan:   Post-operative Plan:   Informed Consent: I have reviewed the patients History and Physical, chart, labs and discussed the procedure including the risks, benefits and alternatives for the proposed anesthesia with the patient or authorized representative who has indicated his/her understanding and acceptance.       Plan Discussed with:   Anesthesia Plan Comments: (38.5 wk G2P1 w Bmi 36.8 for LEA)       Anesthesia Quick Evaluation

## 2022-07-04 NOTE — MAU Note (Signed)
Pt informed that the ultrasound is considered a limited OB ultrasound and is not intended to be a complete ultrasound exam.  Patient also informed that the ultrasound is not being completed with the intent of assessing for fetal or placental anomalies or any pelvic abnormalities.  Explained that the purpose of today's ultrasound is to assess for  presentation.  Patient acknowledges the purpose of the exam and the limitations of the study.     Pt is vertex.  

## 2022-07-04 NOTE — L&D Delivery Note (Signed)
Delivery Note At 8:03 AM a viable female was delivered via Vaginal, Spontaneous (Presentation: Right Occiput Anterior).  APGAR: 9, 9; weight  .   Placenta status: Spontaneous, Intact.  Cord: 3 vessels with the following complications: None.  Cord pH: n/a  Anesthesia: Epidural Episiotomy: None Lacerations: 2nd degree Suture Repair: 3.0 vicryl rapide Est. Blood Loss (mL):  125  Mom to postpartum.  Baby to Couplet care / Skin to Skin.  Linda Hedges 07/05/2022, 8:26 AM

## 2022-07-05 ENCOUNTER — Encounter (HOSPITAL_COMMUNITY): Payer: Self-pay | Admitting: Obstetrics and Gynecology

## 2022-07-05 ENCOUNTER — Inpatient Hospital Stay (HOSPITAL_COMMUNITY): Payer: Managed Care, Other (non HMO) | Admitting: Anesthesiology

## 2022-07-05 DIAGNOSIS — Z3490 Encounter for supervision of normal pregnancy, unspecified, unspecified trimester: Secondary | ICD-10-CM

## 2022-07-05 LAB — RPR: RPR Ser Ql: NONREACTIVE

## 2022-07-05 MED ORDER — EPHEDRINE 5 MG/ML INJ: 10.0000 mg | INTRAVENOUS | Status: DC | PRN

## 2022-07-05 MED ORDER — SIMETHICONE 80 MG PO CHEW: 80.0000 mg | CHEWABLE_TABLET | ORAL | Status: DC | PRN

## 2022-07-05 MED ORDER — FENTANYL-BUPIVACAINE-NACL 0.5-0.125-0.9 MG/250ML-% EP SOLN
EPIDURAL | Status: DC | PRN
  Administered 2022-07-05: 12 mL/h via EPIDURAL

## 2022-07-05 MED ORDER — AMOXICILLIN 500 MG PO CAPS
500.0000 mg | ORAL_CAPSULE | Freq: Three times a day (TID) | ORAL | Status: DC
  Filled 2022-07-05: qty 1

## 2022-07-05 MED ORDER — DIPHENHYDRAMINE HCL 50 MG/ML IJ SOLN: 12.5000 mg | INTRAMUSCULAR | Status: DC | PRN

## 2022-07-05 MED ORDER — TETANUS-DIPHTH-ACELL PERTUSSIS 5-2.5-18.5 LF-MCG/0.5 IM SUSY: 0.5000 mL | PREFILLED_SYRINGE | Freq: Once | INTRAMUSCULAR | Status: DC

## 2022-07-05 MED ORDER — FENTANYL-BUPIVACAINE-NACL 0.5-0.125-0.9 MG/250ML-% EP SOLN
12.0000 mL/h | EPIDURAL | Status: DC | PRN
  Filled 2022-07-05: qty 250

## 2022-07-05 MED ORDER — LACTATED RINGERS IV SOLN
500.0000 mL | Freq: Once | INTRAVENOUS | Status: AC
  Administered 2022-07-05: 500 mL via INTRAVENOUS

## 2022-07-05 MED ORDER — ZOLPIDEM TARTRATE 5 MG PO TABS: 5.0000 mg | ORAL_TABLET | Freq: Every evening | ORAL | Status: DC | PRN

## 2022-07-05 MED ORDER — ESCITALOPRAM OXALATE 10 MG PO TABS
20.0000 mg | ORAL_TABLET | Freq: Every day | ORAL | Status: DC
  Administered 2022-07-06 – 2022-07-07 (×2): 20 mg via ORAL
  Filled 2022-07-05 (×2): qty 2

## 2022-07-05 MED ORDER — LIDOCAINE HCL (PF) 1 % IJ SOLN
INTRAMUSCULAR | Status: DC | PRN
  Administered 2022-07-05: 5 mL via EPIDURAL

## 2022-07-05 MED ORDER — WITCH HAZEL-GLYCERIN EX PADS: 1.0000 | MEDICATED_PAD | CUTANEOUS | Status: DC | PRN

## 2022-07-05 MED ORDER — SENNOSIDES-DOCUSATE SODIUM 8.6-50 MG PO TABS
2.0000 | ORAL_TABLET | Freq: Every day | ORAL | Status: DC
  Filled 2022-07-05 (×2): qty 2

## 2022-07-05 MED ORDER — AMOXICILLIN 250 MG PO CAPS
250.0000 mg | ORAL_CAPSULE | Freq: Three times a day (TID) | ORAL | Status: DC
  Filled 2022-07-05: qty 1

## 2022-07-05 MED ORDER — DIBUCAINE (PERIANAL) 1 % EX OINT: 1.0000 | TOPICAL_OINTMENT | CUTANEOUS | Status: DC | PRN

## 2022-07-05 MED ORDER — AMOXICILLIN 250 MG PO CAPS: 250.0000 mg | ORAL_CAPSULE | Freq: Three times a day (TID) | ORAL | Status: DC

## 2022-07-05 MED ORDER — FAMOTIDINE 20 MG PO TABS
20.0000 mg | ORAL_TABLET | Freq: Two times a day (BID) | ORAL | Status: DC
  Administered 2022-07-05 – 2022-07-07 (×3): 20 mg via ORAL
  Filled 2022-07-05 (×3): qty 1

## 2022-07-05 MED ORDER — AMOXICILLIN 500 MG PO CAPS
500.0000 mg | ORAL_CAPSULE | Freq: Three times a day (TID) | ORAL | Status: DC
  Administered 2022-07-05 – 2022-07-07 (×6): 500 mg via ORAL
  Filled 2022-07-05 (×6): qty 1

## 2022-07-05 MED ORDER — ONDANSETRON HCL 4 MG PO TABS: 4.0000 mg | ORAL_TABLET | ORAL | Status: DC | PRN

## 2022-07-05 MED ORDER — PRENATAL MULTIVITAMIN CH
1.0000 | ORAL_TABLET | Freq: Every day | ORAL | Status: DC
  Administered 2022-07-05 – 2022-07-06 (×2): 1 via ORAL
  Filled 2022-07-05 (×2): qty 1

## 2022-07-05 MED ORDER — DIPHENHYDRAMINE HCL 25 MG PO CAPS: 25.0000 mg | ORAL_CAPSULE | Freq: Four times a day (QID) | ORAL | Status: DC | PRN

## 2022-07-05 MED ORDER — ACETAMINOPHEN 325 MG PO TABS
650.0000 mg | ORAL_TABLET | ORAL | Status: DC | PRN
  Administered 2022-07-05 – 2022-07-07 (×4): 650 mg via ORAL
  Filled 2022-07-05 (×4): qty 2

## 2022-07-05 MED ORDER — ONDANSETRON HCL 4 MG/2ML IJ SOLN
4.0000 mg | INTRAMUSCULAR | Status: DC | PRN
  Administered 2022-07-05: 4 mg via INTRAVENOUS
  Filled 2022-07-05: qty 2

## 2022-07-05 MED ORDER — ESCITALOPRAM OXALATE 10 MG PO TABS: 20.0000 mg | ORAL_TABLET | Freq: Every day | ORAL | Status: DC

## 2022-07-05 MED ORDER — IBUPROFEN 600 MG PO TABS
600.0000 mg | ORAL_TABLET | Freq: Four times a day (QID) | ORAL | Status: DC
  Administered 2022-07-05 – 2022-07-07 (×7): 600 mg via ORAL
  Filled 2022-07-05 (×7): qty 1

## 2022-07-05 MED ORDER — BENZOCAINE-MENTHOL 20-0.5 % EX AERO
1.0000 | INHALATION_SPRAY | CUTANEOUS | Status: DC | PRN
  Filled 2022-07-05: qty 56

## 2022-07-05 MED ORDER — COCONUT OIL OIL
1.0000 | TOPICAL_OIL | Status: DC | PRN
  Administered 2022-07-06: 1 via TOPICAL

## 2022-07-05 MED ORDER — PHENYLEPHRINE 80 MCG/ML (10ML) SYRINGE FOR IV PUSH (FOR BLOOD PRESSURE SUPPORT): 80.0000 ug | PREFILLED_SYRINGE | INTRAVENOUS | Status: DC | PRN

## 2022-07-05 NOTE — Progress Notes (Signed)
Haley Mora is a 37 y.o. G2P1001 at [redacted]w[redacted]d by ultrasound admitted for rupture of membranes  Subjective: No c/o.  Comfortable with CLEA.  No diarrhea x >24 hours.  Objective: BP 117/78   Pulse 98   Temp 98.1 F (36.7 C) (Axillary)   Resp 16   Ht 5\' 6"  (1.676 m)   Wt 103.4 kg   SpO2 99%   BMI 36.80 kg/m  I/O last 3 completed shifts: In: -  Out: 925 [Urine:925] No intake/output data recorded.  FHT:  FHR: 135 bpm, variability: moderate,  accelerations:  Present,  decelerations:  Absent UC:   regular, every 2 minutes SVE:   c/c/+2  Labs: Lab Results  Component Value Date   WBC 7.7 07/04/2022   HGB 11.3 (L) 07/04/2022   HCT 34.0 (L) 07/04/2022   MCV 98.6 07/04/2022   PLT 181 07/04/2022    Assessment / Plan: Augmentation of labor, progressing well  Labor:  Start to push Preeclampsia:   n/a Fetal Wellbeing:  Category I Pain Control:  Epidural I/D:  n/a Anticipated MOD:  NSVD  Linda Hedges, DO 07/05/2022, 7:47 AM

## 2022-07-05 NOTE — Progress Notes (Signed)
Comf with cle  9/100/+1 Continue pitocin.  Anticipate second stage soon.

## 2022-07-05 NOTE — Lactation Note (Signed)
This note was copied from a baby's chart. Lactation Consultation Note  Patient Name: Girl Remi Rester [id number] Date: 07/05/2022 Reason for consult: Follow-up assessment Age:37 hours  P2, Mother and baby resting but willing to speak with LC briefly.  Mother states she struggled with latching first child and eventually changed to pump and bottle feeding due to inverted nipples per mother.  She pumped for 4 mos. Mother states she has started using a nipple shield and prepumps with a hand pump. Discussed considering pumping with a DEBP after feedings to stimulate supply. Lactation to follow up.  Maternal Data Does the patient have breastfeeding experience prior to this delivery?: Yes  Feeding Mother's Current Feeding Choice: Breast Milk   Lactation Tools Discussed/Used Tools: Nipple [place] Fuel;Pump Breast pump type: Manual  Interventions Interventions: Education;LC Services brochure Consult Status Consult Status: Follow-up Date: 07/05/22 Follow-up type: In-patient    Vivianne Master [facility] 07/05/2022, 3:02 PM

## 2022-07-05 NOTE — Progress Notes (Signed)
Went to check on patient to see if it was time to push. Patient declined exam and stated she would like to feel more pressure before we check her again.

## 2022-07-05 NOTE — Progress Notes (Signed)
MOB was referred for history of anxiety. * Referral screened out by Clinical Social Worker because none of the following criteria appear to apply: ~ History of anxiety/depression during this pregnancy, or of post-partum depression following prior delivery. ~ Diagnosis of anxiety and/or depression within last 3 years OR * MOB's symptoms currently being treated with medication and/or therapy. MOB is taking escitalopram 20mg  daily. Please contact the Clinical Social Worker if needs arise, by [facility] request, or if MOB scores greater than 9/yes to question 10 on Edinburgh Postpartum Depression Screen.  Signed,  Berniece Salines, MSW, LCSWA, LCASA 07/05/2022 3:54 PM

## 2022-07-05 NOTE — Anesthesia Procedure Notes (Signed)
Epidural Patient location during procedure: OB Start time: 07/05/2022 1:19 AM End time: 07/05/2022 1:31 AM  Staffing Anesthesiologist: Barnet Glasgow, MD Performed: anesthesiologist   Preanesthetic Checklist Completed: patient identified, IV checked, site marked, risks and benefits discussed, surgical consent, monitors and equipment checked, pre-op evaluation and timeout performed  Epidural Patient position: sitting Prep: DuraPrep and site prepped and draped Patient monitoring: continuous pulse ox and blood pressure Approach: midline Location: L3-L4 Injection technique: LOR air  Needle:  Needle type: Tuohy  Needle gauge: 17 G Needle length: 9 cm and 9 Needle insertion depth: 6 cm Catheter type: closed end flexible Catheter size: 19 Gauge Catheter at skin depth: 12 cm Test dose: negative  Assessment Events: blood not aspirated, no cerebrospinal fluid, injection not painful, no injection resistance, no paresthesia and negative IV test  Additional Notes Patient identified. Risks/Benefits/Options discussed with patient including but not limited to bleeding, infection, nerve damage, paralysis, failed block, incomplete pain control, headache, blood pressure changes, nausea, vomiting, reactions to medication both or allergic, itching and postpartum back pain. Confirmed with bedside nurse the patient's most recent platelet count. Confirmed with patient that they are not currently taking any anticoagulation, have any bleeding history or any family history of bleeding disorders. Patient expressed understanding and wished to proceed. All questions were answered. Sterile technique was used throughout the entire procedure. Please see nursing notes for vital signs. Test dose was given through epidural needle and negative prior to continuing to dose epidural or start infusion. Warning signs of high block given to the patient including shortness of breath, tingling/numbness in hands, complete motor  block, or any concerning symptoms with instructions to call for help. Patient was given instructions on fall risk and not to get out of bed. All questions and concerns addressed with instructions to call with any issues.  1  Attempt (S) . Patient tolerated procedure well.

## 2022-07-05 NOTE — Lactation Note (Addendum)
This note was copied from a baby's chart. Lactation Consultation Note  Patient Name: Haley Mora [id number] Date: 07/05/2022 Age: 37 mins PP Reason for consult: L&D Initial assessment;Early term 37-38.6wks;Breastfeeding assistance;Difficult latch (LC L/D visit @ 50 mins, Baby STS, rooting, LC offered to assist to latch, mom receptive. Baby latched with assist , few strongs sucks and off. Prior to [facility] baby latched for 5 mins with family member assist. Mom aware she will be seen on MBU.) Due to DL , [place] recommends a hand pump for prepumping to start, and shells when not STS.   Maternal Data Does the patient have breastfeeding experience prior to this delivery?: Yes How long did the patient breastfeed?: per mom DL , and pumped for 4 months with good milk supply  Feeding Mother's Current Feeding Choice: Breast Milk  LATCH Score Latch: Repeated attempts needed to sustain latch, nipple held in mouth throughout feeding, stimulation needed to elicit sucking reflex.  Audible Swallowing: None  Type of Nipple: Flat (semi inverted to flat, and semi compressible areolas)  Comfort (Breast/Nipple): Soft / non-tender  Hold (Positioning): Full assist, staff holds infant at breast  LATCH Score: 4     Interventions Interventions: Breast feeding basics reviewed;Assisted with latch;Skin to skin;Reverse pressure;Adjust position;Support pillows;Education  Discharge    Consult Status Consult Status: Follow-up from L&D Date: 07/05/22 Follow-up type: In-patient    [place] 07/05/2022, 9:27 AM

## 2022-07-06 LAB — CBC
HCT: 29.8 % — ABNORMAL LOW (ref 36.0–46.0)
Hemoglobin: 9.8 g/dL — ABNORMAL LOW (ref 12.0–15.0)
MCH: 32.1 pg (ref 26.0–34.0)
MCHC: 32.9 g/dL (ref 30.0–36.0)
MCV: 97.7 fL (ref 80.0–100.0)
Platelets: 151 10*3/uL (ref 150–400)
RBC: 3.05 MIL/uL — ABNORMAL LOW (ref 3.87–5.11)
RDW: 15.9 % — ABNORMAL HIGH (ref 11.5–15.5)
WBC: 9.9 10*3/uL (ref 4.0–10.5)
nRBC: 0 % (ref 0.0–0.2)

## 2022-07-06 MED ORDER — IBUPROFEN 600 MG PO TABS: 600.0000 mg | ORAL_TABLET | Freq: Four times a day (QID) | ORAL | 0 refills | Status: AC

## 2022-07-06 MED ORDER — ACETAMINOPHEN 325 MG PO TABS: 650.0000 mg | ORAL_TABLET | ORAL | 0 refills | Status: AC | PRN

## 2022-07-06 NOTE — Lactation Note (Signed)
This note was copied from a baby's chart. Lactation Consultation Note  Patient Name: Haley Mora [id number] Date: 07/06/2022 Reason for consult: Follow-up assessment Age:37 hours  P2, +DAT.  Bilirubin increasing.  Mother states baby has been sleepy. Suggest placing baby skin to skin if she does not feed after 3 hours.  Mother is using 20 NS to latch baby and post pumping.  Pacifier use not recommended at this time.  Mother has bilateral abrasions on tips of inverted nipples. For soreness suggest mother apply ebm or coconut oil while wearing shells and alternate with comfort gels. Offered to assist with latching and mother declined at this time.  Demonstrated how to syringe and cup feed since mother stated baby did not take slow flow nipple.   Maternal Data Has patient been taught Hand Expression?: Yes  Feeding Mother's Current Feeding Choice: Breast Milk  Lactation Tools Discussed/Used Tools: Shells;Pump;Flanges;Coconut oil;Comfort gels;Nipple Shields Nipple shield size: 20 Flange Size: 21 Breast pump type: Double-Electric Breast Pump Reason for Pumping: Nipple shield Pumped volume: 10 mL  Interventions Interventions: Education;DEBP;Coconut oil;Comfort gels;Shells;Pre-pump if needed  Discharge Pump: Personal  Consult Status Consult Status: Follow-up Date: 07/07/22 Follow-up type: In-patient    Vivianne Master [facility] 07/06/2022, 2:32 PM

## 2022-07-06 NOTE — Lactation Note (Addendum)
This note was copied from a baby's chart. Lactation Consultation Note  Patient Name: Haley Mora Date: 07/06/2022   Age:37 hours, ETI female infant. Birth Parent wanted to be seen tonight. Birth Parent has been using 20 mm NS, LC worked with [employer] Parent having better fit with 20 mm NS. Infant latched for 10 minutes on right breast and was switched to left breast and infant BF for a total of 15 minutes, colostrum was present in NS and Birth Parent nipple was everted outward with stimulation. Infant briefly latched a few seconds without the NS.Birth Parent was set up with DEBP and was still expressing colostrum when Thatcher left the  room and had expressed 7 mls. Birth Parent wants to use slow flow bottle nipple, she will offer infant her EBM at the next feeding after latching infant at the breast.Birth Parent will continue to BF infant according to hunger cues, on demand, STS. Birth Parent knows to ask RN/LC for further latch assistance if needed. Birth Parent will use hand pump with 21 flange and pre-pump breast prior to latching infant without 20 mm NS. Birth Parent will continue to use DEBP every 3 hours for 15 minutes on initial setting.   Maternal Data    Feeding    LATCH Score                    Lactation Tools Discussed/Used    Interventions    Discharge    Consult Status      Haley Mora 07/06/2022, 1:04 AM

## 2022-07-06 NOTE — Progress Notes (Signed)
Postpartum Progress Note  Post Partum Day 1 s/p spontaneous vaginal delivery.  Patient reports well-controlled pain, ambulating without difficulty, voiding spontaneously, tolerating PO.  Vaginal bleeding is appropriate.   Objective: Blood pressure 106/78, pulse 69, temperature 98.3 F (36.8 C), temperature source Oral, resp. rate 16, height 5\' 6"  (1.676 m), weight 103.4 kg, SpO2 97 %, unknown if currently breastfeeding.  Physical Exam:  General: alert and no distress Lochia: appropriate Uterine Fundus: firm DVT Evaluation: No evidence of DVT seen on physical exam.  Recent Labs    07/04/22 1537 07/06/22 0544  HGB 11.3* 9.8*  HCT 34.0* 29.8*    Assessment/Plan: Postpartum Day 1, s/p vaginal delivery. Continue routine postpartum care Acute blood loss anemia - Fe/Colace. No signs or symptoms of anemia.  Dental infection - began amoxicillin prior to delivery. Will finish course.  Lactation following Anticipate discharge home today   LOS: 2 days   Carlyon Shadow 07/06/2022, 7:54 AM

## 2022-07-06 NOTE — Progress Notes (Addendum)
Pt does not want to be "bother" and would prefer "cluster care" and stated that she did not want to have no one to come in until 1230a, for baby assessment.

## 2022-07-07 NOTE — Discharge Summary (Signed)
Postpartum Discharge Summary  Date of Service updated1/4/24     Patient Name: Haley Mora DOB: [birth year] MRN: [id number]  Date of admission: 07/04/2022 Delivery date:07/05/2022  Delivering provider: Linda Hedges  Date of discharge: 07/07/2022  Admitting diagnosis: Indication for care in labor or delivery [O75.9] Pregnancy [Z34.90] Intrauterine pregnancy: [redacted]w[redacted]d    Secondary diagnosis:  Principal Problem:   Indication for care in labor or delivery Active Problems:   Pregnancy  Additional problems:     Discharge diagnosis: Term Pregnancy Delivered                                              Post partum procedures: Augmentation: Pitocin and Cytotec Complications: None  Hospital course: Induction of Labor With Vaginal Delivery   37y.o. yo [id number] admitted to the hospital 07/04/2022 for induction of labor.  Indication for induction: PROM.  Patient had an labor course complicated by Membrane Rupture Time/Date: 8:00 PM ,07/03/2022   Delivery Method:Vaginal, Spontaneous  Episiotomy: None  Lacerations:  2nd degree  Details of delivery can be found in separate delivery note.  Patient had a postpartum course complicated by. Patient is discharged home 07/07/22.  Newborn Data: Birth date:07/05/2022  Birth time:8:03 AM  Gender:Female  Living status:Living  Apgars:9 ,9  Weight:3730 g   Magnesium Sulfate received: No BMZ received: No Rhophylac:No MMR:No T-DaP:Given prenatally Flu: No Transfusion:No  Physical exam  Vitals:   07/06/22 0012 07/06/22 0518 07/06/22 1457 07/06/22 2034  BP: 119/78 106/78 120/83 116/78  Pulse: 84 69 80 87  Resp: _0 Temp: 98.5 F (36.9 C) 98.3 F (36.8 C) 97.9 F (36.6 C) 97.8 F (36.6 C)  TempSrc: Oral Oral  Oral  SpO2: 97%  99% 97%  Weight:      Height:       General: alert, cooperative, and no distress Lochia: appropriate Uterine Fundus: firm Incision: Healing well with no significant drainage DVT Evaluation: No  evidence of DVT seen on physical exam. Labs: Lab Results  Component Value Date   WBC 9.9 07/06/2022   HGB 9.8 (L) 07/06/2022   HCT 29.8 (L) 07/06/2022   MCV 97.7 07/06/2022   PLT 151 07/06/2022       No data to display         Edinburgh Score:    07/05/2022   10:00 AM  Edinburgh Postnatal Depression Scale Screening Tool  I have been able to laugh and see the funny side of things. 0  I have looked forward with enjoyment to things. 0  I have blamed myself unnecessarily when things went wrong. 0  I have been anxious or worried for no good reason. 0  I have felt scared or panicky for no good reason. 0  Things have been getting on top of me. 0  I have been so unhappy that I have had difficulty sleeping. 0  I have felt sad or miserable. 0  I have been so unhappy that I have been crying. 0  The thought of harming myself has occurred to me. 0  Edinburgh Postnatal Depression Scale Total 0      After visit meds:  Allergies as of 07/07/2022       Reactions   Mango Flavor Itching        Medication List     STOP taking  these medications    oxyCODONE-acetaminophen 5-325 MG tablet Commonly known as: PERCOCET/ROXICET       TAKE these medications    acetaminophen 325 MG tablet Commonly known as: Tylenol Take 2 tablets (650 mg total) by mouth every 4 (four) hours as needed (for pain scale < 4).   amoxicillin 250 MG capsule Commonly known as: AMOXIL Take 250 mg by mouth 3 (three) times daily.   escitalopram 20 MG tablet Commonly known as: LEXAPRO Take 20 mg by mouth daily.   ibuprofen 600 MG tablet Commonly known as: ADVIL Take 1 tablet (600 mg total) by mouth every 6 (six) hours.   prenatal multivitamin Tabs tablet Take 1 tablet by mouth daily at 12 noon.         Discharge home in stable condition Infant Feeding: Bottle Infant Disposition:home with mother Discharge instruction: per After Visit Summary and Postpartum booklet. Activity: Advance as  tolerated. Pelvic rest for 6 weeks.  Diet: routine diet Anticipated Birth Control: Unsure Postpartum Appointment:6 weeks Additional Postpartum F/U:  Future Appointments:No future appointments. Follow up Visit:  [place] Follow up.   Why: Please follow up for a 6 week postpartum appointment. Contact information: [address] [place] [address] [phone]                     07/07/2022 Allena Katz, MD

## 2022-07-07 NOTE — Lactation Note (Signed)
This note was copied from a baby's chart. Lactation Consultation Note  Patient Name: Haley Mora [id number] Date: 07/07/2022 Reason for consult: Follow-up assessment Age:37 hours  P2, Mother states due to soreness she is breastfeeding on one breast per feeding until soreness resolves.  Encouraged her to post pump both breasts q 3 hours and give volume back to baby.  Also suggest OP appt.  Mother states her Ped MD office has a [place].  Reviewed engorgement care and monitoring voids/stools. Provided comfort gels for sorness.  Mother is using NS to latch baby.   Maternal Data Has patient been taught Hand Expression?: Yes  Feeding Mother's Current Feeding Choice: Breast Milk  LATCH Score Latch: Grasps breast easily, tongue down, lips flanged, rhythmical sucking.  Audible Swallowing: A few with stimulation  Type of Nipple: Everted at rest and after stimulation  Comfort (Breast/Nipple): Filling, red/small blisters or bruises, mild/mod discomfort  Hold (Positioning): No assistance needed to correctly position infant at breast.  LATCH Score: 8   Lactation Tools Discussed/Used Tools: Pump  Interventions Interventions: Education;DEBP  Discharge Discharge Education: Engorgement and breast care;Warning signs for feeding baby Pump: Personal  Consult Status Consult Status: Complete Date: 07/07/22    Vivianne Master [facility] 07/07/2022, 10:39 AM

## 2022-07-08 NOTE — Anesthesia Postprocedure Evaluation (Signed)
Anesthesia Post Note  Patient: Haley Mora  Procedure(s) Performed: AN AD [facility]     Patient location during evaluation: Mother Baby Anesthesia Type: Epidural Level of consciousness: awake and alert Pain management: pain level controlled Vital Signs Assessment: post-procedure vital signs reviewed and stable Respiratory status: spontaneous breathing, nonlabored ventilation and respiratory function stable Cardiovascular status: stable Postop Assessment: no headache, no backache and epidural receding Anesthetic complications: no   No notable events documented.  Last Vitals: There were no vitals filed for this visit.  Last Pain: There were no vitals filed for this visit. Pain Goal:                   [name]

## 2022-07-12 ENCOUNTER — Telehealth (HOSPITAL_COMMUNITY): Payer: Self-pay | Admitting: *Deleted

## 2022-07-12 NOTE — Telephone Encounter (Signed)
Patient voiced no questions or concerns regarding her health at this time. EPDS= 0 Patient voiced no questions or concerns regarding infant at this time. Patient reports infant sleeps in a bassinet on her back. RN reviewed ABCs of safe sleep. Patient verbalized understanding. Patient informed about hospital's virtual postpartum classes and support groups. Declined email information at this time. Erline Levine, RN, 07/12/22, [phone]

## 2022-07-15 NOTE — Progress Notes (Signed)
Coding query:  Acute blood loss anemia was noted, iron and colace given as management.
